# Patient Record
Sex: Male | Born: 1990 | Race: Black or African American | Hispanic: No | Marital: Single
Health system: Southern US, Community
[De-identification: ages and names within clinical notes are randomized; demographics above are authoritative.]

---
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
## 2013-03-25 ENCOUNTER — Emergency Department: Payer: Self-pay | Admitting: Internal Medicine

## 2013-03-26 LAB — BETA STREP CULTURE(ARMC)

## 2014-01-10 ENCOUNTER — Emergency Department: Payer: Self-pay | Admitting: Emergency Medicine

## 2014-09-14 ENCOUNTER — Emergency Department: Payer: Medicaid Other

## 2014-09-14 ENCOUNTER — Emergency Department
Admission: EM | Admit: 2014-09-14 | Discharge: 2014-09-14 | Disposition: A | Discharge status: Other Acute Inpatient Hospital | Payer: Medicaid Other | Attending: Emergency Medicine | Admitting: Emergency Medicine

## 2014-09-14 DIAGNOSIS — W3400XA Accidental discharge from unspecified firearms or gun, initial encounter: Secondary | ICD-10-CM | POA: Diagnosis not present

## 2014-09-14 DIAGNOSIS — Y998 Other external cause status: Secondary | ICD-10-CM | POA: Diagnosis not present

## 2014-09-14 DIAGNOSIS — Y9389 Activity, other specified: Secondary | ICD-10-CM | POA: Diagnosis not present

## 2014-09-14 DIAGNOSIS — Y9289 Other specified places as the place of occurrence of the external cause: Secondary | ICD-10-CM | POA: Insufficient documentation

## 2014-09-14 DIAGNOSIS — S41001A Unspecified open wound of right shoulder, initial encounter: Secondary | ICD-10-CM | POA: Diagnosis not present

## 2014-09-14 LAB — COMPREHENSIVE METABOLIC PANEL
ALT: 102 U/L — AB (ref 17–63)
AST: 194 U/L — AB (ref 15–41)
Albumin: 3.6 g/dL (ref 3.5–5.0)
Alkaline Phosphatase: 54 U/L (ref 38–126)
Anion gap: 16 — ABNORMAL HIGH (ref 5–15)
BILIRUBIN TOTAL: 0.8 mg/dL (ref 0.3–1.2)
BUN: 11 mg/dL (ref 6–20)
CALCIUM: 8.3 mg/dL — AB (ref 8.9–10.3)
CHLORIDE: 102 mmol/L (ref 101–111)
CO2: 21 mmol/L — AB (ref 22–32)
Creatinine, Ser: 1.5 mg/dL — ABNORMAL HIGH (ref 0.61–1.24)
GLUCOSE: 331 mg/dL — AB (ref 65–99)
Potassium: 2.7 mmol/L — CL (ref 3.5–5.1)
Sodium: 139 mmol/L (ref 135–145)
Total Protein: 5.8 g/dL — ABNORMAL LOW (ref 6.5–8.1)

## 2014-09-14 LAB — CBC
HCT: 38.7 % — ABNORMAL LOW (ref 40.0–52.0)
Hemoglobin: 12.4 g/dL — ABNORMAL LOW (ref 13.0–18.0)
MCH: 29.8 pg (ref 26.0–34.0)
MCHC: 32.1 g/dL (ref 32.0–36.0)
MCV: 92.8 fL (ref 80.0–100.0)
PLATELETS: 128 10*3/uL — AB (ref 150–440)
RBC: 4.17 MIL/uL — ABNORMAL LOW (ref 4.40–5.90)
RDW: 12.8 % (ref 11.5–14.5)
WBC: 7.7 10*3/uL (ref 3.8–10.6)

## 2014-09-14 LAB — BLOOD GAS, ARTERIAL
ACID-BASE DEFICIT: 8.4 mmol/L — AB (ref 0.0–2.0)
Allens test (pass/fail): POSITIVE — AB
BICARBONATE: 17.9 meq/L — AB (ref 21.0–28.0)
FIO2: 1 %
Mechanical Rate: 12
O2 Saturation: 99.9 %
PO2 ART: 334 mmHg — AB (ref 83.0–108.0)
Patient temperature: 37
pCO2 arterial: 39 mmHg (ref 32.0–48.0)
pH, Arterial: 7.27 — ABNORMAL LOW (ref 7.350–7.450)

## 2014-09-14 LAB — TROPONIN I: Troponin I: <0.03 ng/mL (ref <0.031)

## 2014-09-14 LAB — ABO/RH: ABO/RH(D): O POS

## 2014-09-14 MED ORDER — SODIUM CHLORIDE 0.9 % IV SOLN
INTRAVENOUS | Status: DC
  Administered 2014-09-14: 1000 mL via INTRAVENOUS

## 2014-09-14 MED ORDER — NOREPINEPHRINE BITARTRATE 1 MG/ML IV SOLN: 0.0000 ug/min | Freq: Once | INTRAVENOUS | Status: AC

## 2014-09-14 MED ORDER — NOREPINEPHRINE 4 MG/250ML-% IV SOLN
INTRAVENOUS | Status: AC
  Administered 2014-09-14: 5 ug/min
  Filled 2014-09-14: qty 250

## 2014-09-14 NOTE — ED Notes (Addendum)
Dr. Carin Primrose notified of hypertension. No new orders received.
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
## 2014-09-14 NOTE — ED Notes (Signed)
Verbal report given Becky,RN with [facility] air

## 2014-09-14 NOTE — ED Notes (Signed)
Patient belongings (keys, jewelry,clothes) given to officer [place].
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
## 2014-09-14 NOTE — ED Notes (Signed)
Levophed stopped to hypertension

## 2014-09-14 NOTE — ED Notes (Signed)
Patient care taken over by [facility] air.
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
## 2014-09-14 NOTE — ED Notes (Signed)
Unit # [id number]  and Unit #[id number] completed.

## 2014-09-14 NOTE — ED Notes (Signed)
 of blood loss from oral cavity per respiratory therapist Misty Stanley.
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
## 2014-09-14 NOTE — ED Notes (Signed)
Pt belongings in brown paper bag, giver to Officer Lesly Rubenstein with [employer]. 4 sterile urine cups with pt gold colored jewelry with belongings given to [employer].

## 2014-09-14 NOTE — ED Notes (Signed)
Mother at bedside.  Instructions on directions being given to mother by [facility].

## 2014-09-14 NOTE — ED Notes (Addendum)
Unit # [id number] of O positive PRBC started emergently per policy and procedure.
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
## 2014-09-14 NOTE — ED Notes (Signed)
Telephone report called to Missy at [facility].  Air transport arriving in 10-15 minutes.

## 2014-09-15 LAB — TYPE AND SCREEN
ABO/RH(D): O POS
Antibody Screen: NEGATIVE
UNIT DIVISION: 0
UNIT DIVISION: 0

## 2014-09-15 MED FILL — Medication: Qty: 1 | Status: AC
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
